# Patient Record
Sex: Male | Born: 1970 | Race: White | Hispanic: No | Smoking: Current every day smoker
Health system: Southern US, Community
[De-identification: ages and names within clinical notes are randomized; demographics above are authoritative.]

## PROBLEM LIST (undated history)

## (undated) HISTORY — PX: HERNIA REPAIR: SHX51

---

## 2003-01-30 ENCOUNTER — Emergency Department (HOSPITAL_COMMUNITY): Admission: EM | Admit: 2003-01-30 | Discharge: 2003-01-30 | Payer: Self-pay | Admitting: Emergency Medicine

## 2003-01-30 ENCOUNTER — Encounter: Payer: Self-pay | Admitting: Emergency Medicine

## 2003-05-14 ENCOUNTER — Encounter: Payer: Self-pay | Admitting: Emergency Medicine

## 2003-05-14 ENCOUNTER — Emergency Department (HOSPITAL_COMMUNITY): Admission: EM | Admit: 2003-05-14 | Discharge: 2003-05-14 | Payer: Self-pay | Admitting: Emergency Medicine

## 2003-06-05 ENCOUNTER — Emergency Department (HOSPITAL_COMMUNITY): Admission: EM | Admit: 2003-06-05 | Discharge: 2003-06-05 | Payer: Self-pay | Admitting: Emergency Medicine

## 2003-06-05 ENCOUNTER — Encounter: Payer: Self-pay | Admitting: Emergency Medicine

## 2003-06-06 ENCOUNTER — Emergency Department (HOSPITAL_COMMUNITY): Admission: EM | Admit: 2003-06-06 | Discharge: 2003-06-06 | Payer: Self-pay

## 2003-08-09 ENCOUNTER — Emergency Department (HOSPITAL_COMMUNITY): Admission: EM | Admit: 2003-08-09 | Discharge: 2003-08-09 | Payer: Self-pay | Admitting: Emergency Medicine

## 2003-08-09 ENCOUNTER — Encounter: Payer: Self-pay | Admitting: Emergency Medicine

## 2004-10-12 ENCOUNTER — Emergency Department (HOSPITAL_COMMUNITY): Admission: EM | Admit: 2004-10-12 | Discharge: 2004-10-12 | Payer: Self-pay

## 2004-10-15 ENCOUNTER — Emergency Department (HOSPITAL_COMMUNITY): Admission: EM | Admit: 2004-10-15 | Discharge: 2004-10-15 | Payer: Self-pay | Admitting: Emergency Medicine

## 2004-11-09 ENCOUNTER — Emergency Department (HOSPITAL_COMMUNITY): Admission: EM | Admit: 2004-11-09 | Discharge: 2004-11-09 | Payer: Self-pay | Admitting: Emergency Medicine

## 2005-01-10 ENCOUNTER — Emergency Department (HOSPITAL_COMMUNITY): Admission: EM | Admit: 2005-01-10 | Discharge: 2005-01-10 | Payer: Self-pay | Admitting: Emergency Medicine

## 2005-01-22 ENCOUNTER — Emergency Department (HOSPITAL_COMMUNITY): Admission: EM | Admit: 2005-01-22 | Discharge: 2005-01-22 | Payer: Self-pay | Admitting: Emergency Medicine

## 2005-02-17 ENCOUNTER — Emergency Department (HOSPITAL_COMMUNITY): Admission: EM | Admit: 2005-02-17 | Discharge: 2005-02-17 | Payer: Self-pay | Admitting: Emergency Medicine

## 2008-12-12 ENCOUNTER — Emergency Department (HOSPITAL_COMMUNITY): Admission: EM | Admit: 2008-12-12 | Discharge: 2008-12-12 | Payer: Self-pay | Admitting: Family Medicine

## 2009-01-18 ENCOUNTER — Ambulatory Visit: Payer: Self-pay | Admitting: Internal Medicine

## 2009-01-18 ENCOUNTER — Encounter: Payer: Self-pay | Admitting: Family Medicine

## 2009-01-18 LAB — CONVERTED CEMR LAB
Amphetamine Screen, Ur: NEGATIVE
Basophils Relative: 0 % (ref 0–1)
Benzodiazepines.: NEGATIVE
CO2: 26 meq/L (ref 19–32)
Calcium: 9.4 mg/dL (ref 8.4–10.5)
Chloride: 104 meq/L (ref 96–112)
Cholesterol: 186 mg/dL (ref 0–200)
Cocaine Metabolites: NEGATIVE
Creatinine, Ser: 0.8 mg/dL (ref 0.40–1.50)
Eosinophils Absolute: 0.1 10*3/uL (ref 0.0–0.7)
Eosinophils Relative: 2 % (ref 0–5)
Glucose, Bld: 96 mg/dL (ref 70–99)
HCT: 49 % (ref 39.0–52.0)
Hemoglobin: 15.1 g/dL (ref 13.0–17.0)
Lymphs Abs: 1.9 10*3/uL (ref 0.7–4.0)
MCHC: 30.8 g/dL (ref 30.0–36.0)
MCV: 84.5 fL (ref 78.0–100.0)
Marijuana Metabolite: NEGATIVE
Methadone: NEGATIVE
Monocytes Absolute: 0.5 10*3/uL (ref 0.1–1.0)
Monocytes Relative: 10 % (ref 3–12)
RBC: 5.8 M/uL (ref 4.22–5.81)
Total Bilirubin: 0.7 mg/dL (ref 0.3–1.2)
Total CHOL/HDL Ratio: 3
Total Protein: 7.6 g/dL (ref 6.0–8.3)
Triglycerides: 65 mg/dL (ref <150)
VLDL: 13 mg/dL (ref 0–40)

## 2009-02-13 ENCOUNTER — Emergency Department (HOSPITAL_COMMUNITY): Admission: EM | Admit: 2009-02-13 | Discharge: 2009-02-13 | Payer: Self-pay | Admitting: Emergency Medicine

## 2009-03-09 ENCOUNTER — Emergency Department (HOSPITAL_COMMUNITY): Admission: EM | Admit: 2009-03-09 | Discharge: 2009-03-09 | Payer: Self-pay | Admitting: Emergency Medicine

## 2009-03-17 ENCOUNTER — Emergency Department (HOSPITAL_COMMUNITY): Admission: EM | Admit: 2009-03-17 | Discharge: 2009-03-17 | Payer: Self-pay | Admitting: Family Medicine

## 2009-04-01 ENCOUNTER — Emergency Department (HOSPITAL_COMMUNITY): Admission: EM | Admit: 2009-04-01 | Discharge: 2009-04-01 | Payer: Self-pay | Admitting: Family Medicine

## 2009-04-25 ENCOUNTER — Emergency Department (HOSPITAL_COMMUNITY): Admission: EM | Admit: 2009-04-25 | Discharge: 2009-04-25 | Payer: Self-pay | Admitting: Family Medicine

## 2009-05-23 ENCOUNTER — Emergency Department (HOSPITAL_COMMUNITY): Admission: EM | Admit: 2009-05-23 | Discharge: 2009-05-23 | Payer: Self-pay | Admitting: Emergency Medicine

## 2009-06-03 ENCOUNTER — Emergency Department (HOSPITAL_COMMUNITY): Admission: EM | Admit: 2009-06-03 | Discharge: 2009-06-03 | Payer: Self-pay | Admitting: Emergency Medicine

## 2009-06-30 ENCOUNTER — Emergency Department (HOSPITAL_COMMUNITY): Admission: EM | Admit: 2009-06-30 | Discharge: 2009-06-30 | Payer: Self-pay | Admitting: Emergency Medicine

## 2009-10-19 ENCOUNTER — Emergency Department (HOSPITAL_COMMUNITY): Admission: EM | Admit: 2009-10-19 | Discharge: 2009-10-20 | Payer: Self-pay | Admitting: Emergency Medicine

## 2009-10-21 ENCOUNTER — Emergency Department (HOSPITAL_COMMUNITY): Admission: EM | Admit: 2009-10-21 | Discharge: 2009-10-22 | Payer: Self-pay | Admitting: Emergency Medicine

## 2009-10-24 ENCOUNTER — Emergency Department (HOSPITAL_COMMUNITY): Admission: EM | Admit: 2009-10-24 | Discharge: 2009-10-24 | Payer: Self-pay | Admitting: Family Medicine

## 2009-10-26 ENCOUNTER — Emergency Department (HOSPITAL_COMMUNITY): Admission: EM | Admit: 2009-10-26 | Discharge: 2009-10-26 | Payer: Self-pay | Admitting: Family Medicine

## 2009-11-07 ENCOUNTER — Ambulatory Visit: Payer: Self-pay | Admitting: Internal Medicine

## 2011-01-21 LAB — CBC
HCT: 42.5 % (ref 39.0–52.0)
Hemoglobin: 14.4 g/dL (ref 13.0–17.0)
MCHC: 34 g/dL (ref 30.0–36.0)
MCV: 83.2 fL (ref 78.0–100.0)
RBC: 5.11 MIL/uL (ref 4.22–5.81)
RDW: 14.1 % (ref 11.5–15.5)

## 2011-01-21 LAB — POCT CARDIAC MARKERS
CKMB, poc: <1 ng/mL — ABNORMAL LOW (ref 1.0–8.0)
Myoglobin, poc: 72.1 ng/mL (ref 12–200)
Troponin i, poc: <0.05 ng/mL (ref 0.00–0.09)

## 2011-01-21 LAB — BASIC METABOLIC PANEL
CO2: 26 mEq/L (ref 19–32)
Calcium: 9.1 mg/dL (ref 8.4–10.5)
Chloride: 103 mEq/L (ref 96–112)
GFR calc Af Amer: >60 mL/min (ref >60)
Glucose, Bld: 97 mg/dL (ref 70–99)
Sodium: 138 mEq/L (ref 135–145)

## 2011-01-26 LAB — POCT I-STAT, CHEM 8
Creatinine, Ser: 0.8 mg/dL (ref 0.4–1.5)
HCT: 46 % (ref 39.0–52.0)
Hemoglobin: 15.6 g/dL (ref 13.0–17.0)
Potassium: 3.8 mEq/L (ref 3.5–5.1)
Sodium: 139 mEq/L (ref 135–145)
TCO2: 26 mmol/L (ref 0–100)

## 2015-01-10 ENCOUNTER — Emergency Department (INDEPENDENT_AMBULATORY_CARE_PROVIDER_SITE_OTHER)
Admission: EM | Admit: 2015-01-10 | Discharge: 2015-01-10 | Disposition: A | Discharge status: Nursing Home (Includes ICF) | Payer: Self-pay | Source: Home / Self Care | Attending: Family Medicine | Admitting: Family Medicine

## 2015-01-10 ENCOUNTER — Emergency Department (HOSPITAL_COMMUNITY)
Admission: EM | Admit: 2015-01-10 | Discharge: 2015-01-11 | Disposition: A | Discharge status: Home / Self Care | Payer: Medicare Other | Attending: Emergency Medicine | Admitting: Emergency Medicine

## 2015-01-10 ENCOUNTER — Encounter (HOSPITAL_COMMUNITY): Payer: Self-pay

## 2015-01-10 ENCOUNTER — Encounter (HOSPITAL_COMMUNITY): Payer: Self-pay | Admitting: Emergency Medicine

## 2015-01-10 DIAGNOSIS — Z720 Tobacco use: Secondary | ICD-10-CM | POA: Insufficient documentation

## 2015-01-10 DIAGNOSIS — R1012 Left upper quadrant pain: Secondary | ICD-10-CM | POA: Diagnosis present

## 2015-01-10 DIAGNOSIS — R1032 Left lower quadrant pain: Secondary | ICD-10-CM

## 2015-01-10 LAB — POCT URINALYSIS DIP (DEVICE)
BILIRUBIN URINE: NEGATIVE
GLUCOSE, UA: NEGATIVE mg/dL
Hgb urine dipstick: NEGATIVE
KETONES UR: NEGATIVE mg/dL
LEUKOCYTES UA: NEGATIVE
Nitrite: NEGATIVE
PROTEIN: NEGATIVE mg/dL
Urobilinogen, UA: 0.2 mg/dL (ref 0.0–1.0)
pH: 6 (ref 5.0–8.0)

## 2015-01-10 LAB — COMPREHENSIVE METABOLIC PANEL
ALT: 18 U/L (ref 0–53)
AST: 22 U/L (ref 0–37)
Albumin: 4.1 g/dL (ref 3.5–5.2)
Alkaline Phosphatase: 68 U/L (ref 39–117)
Anion gap: 5 (ref 5–15)
BILIRUBIN TOTAL: 0.5 mg/dL (ref 0.3–1.2)
BUN: 23 mg/dL (ref 6–23)
CALCIUM: 8.9 mg/dL (ref 8.4–10.5)
CHLORIDE: 106 mmol/L (ref 96–112)
CO2: 28 mmol/L (ref 19–32)
Creatinine, Ser: 0.9 mg/dL (ref 0.50–1.35)
GLUCOSE: 100 mg/dL — AB (ref 70–99)
Potassium: 3.9 mmol/L (ref 3.5–5.1)
SODIUM: 139 mmol/L (ref 135–145)
Total Protein: 6.5 g/dL (ref 6.0–8.3)

## 2015-01-10 LAB — CBC WITH DIFFERENTIAL/PLATELET
Basophils Absolute: 0 10*3/uL (ref 0.0–0.1)
Basophils Relative: 0 % (ref 0–1)
EOS ABS: 0.1 10*3/uL (ref 0.0–0.7)
EOS PCT: 1 % (ref 0–5)
HEMATOCRIT: 42.1 % (ref 39.0–52.0)
HEMOGLOBIN: 13.9 g/dL (ref 13.0–17.0)
LYMPHS ABS: 2.3 10*3/uL (ref 0.7–4.0)
LYMPHS PCT: 35 % (ref 12–46)
MCH: 27 pg (ref 26.0–34.0)
MCHC: 33 g/dL (ref 30.0–36.0)
MCV: 81.7 fL (ref 78.0–100.0)
MONO ABS: 0.4 10*3/uL (ref 0.1–1.0)
MONOS PCT: 6 % (ref 3–12)
Neutro Abs: 3.8 10*3/uL (ref 1.7–7.7)
Neutrophils Relative %: 58 % (ref 43–77)
PLATELETS: 261 10*3/uL (ref 150–400)
RBC: 5.15 MIL/uL (ref 4.22–5.81)
RDW: 14.7 % (ref 11.5–15.5)
WBC: 6.6 10*3/uL (ref 4.0–10.5)

## 2015-01-10 LAB — LIPASE, BLOOD: LIPASE: 49 U/L (ref 11–59)

## 2015-01-10 MED ORDER — GI COCKTAIL ~~LOC~~
30.0000 mL | Freq: Once | ORAL | Status: AC
  Administered 2015-01-10: 30 mL via ORAL
  Filled 2015-01-10: qty 30

## 2015-01-10 MED ORDER — IOHEXOL 300 MG/ML  SOLN
25.0000 mL | Freq: Once | INTRAMUSCULAR | Status: AC | PRN
  Administered 2015-01-10: 25 mL via ORAL

## 2015-01-10 NOTE — ED Notes (Signed)
Pt states he has had abdominal pain x 3 years, worse over the past 2 days. Denies pain at this time, reports pain only when he is up moving around. Pt alert, oriented, nad.

## 2015-01-10 NOTE — ED Notes (Signed)
Sent here by [facility] for LUQ pain onset 2 days Pain increases w/pressure Denies fevers, chills, urinary sx, constipation Alert, no signs of acute distress.

## 2015-01-10 NOTE — ED Provider Notes (Signed)
CSN: [id number]     Arrival date & time 01/10/15  1544 History   First MD Initiated Contact with Patient 01/10/15 1721     Chief Complaint  Patient presents with  . Abdominal Pain   (Consider location/radiation/quality/duration/timing/severity/associated sxs/prior Treatment) Patient is a 44 y.o. male presenting with abdominal pain. The history is provided by the patient.  Abdominal Pain Pain location:  LUQ Pain quality: sharp   Pain severity:  Mild Duration:  2 days Progression:  Unchanged Chronicity:  Chronic Context: not previous surgeries and not recent illness   Relieved by:  None tried Worsened by:  Nothing tried Ineffective treatments:  None tried Associated symptoms: no anorexia, no belching, no constipation, no diarrhea, no fever, no melena, no nausea and no vomiting     History reviewed. No pertinent past medical history. History reviewed. No pertinent past surgical history. No family history on file. History  Substance Use Topics  . Smoking status: Never Smoker   . Smokeless tobacco: Not on file  . Alcohol Use: No    Review of Systems  Constitutional: Negative.  Negative for fever.  Gastrointestinal: Positive for abdominal pain. Negative for nausea, vomiting, diarrhea, constipation, melena and anorexia.    Allergies  Review of patient's allergies indicates no known allergies.  Home Medications   Prior to Admission medications   Not on File   BP 120/73 mmHg  Pulse 72  Temp(Src) 98.5 F (36.9 C) (Oral)  Resp 20  SpO2 100% Physical Exam  Constitutional: He is oriented to person, place, and time. He appears well-developed and well-nourished. No distress.  Abdominal: Bowel sounds are normal. He exhibits no distension and no mass. There is no hepatosplenomegaly. There is tenderness in the left upper quadrant. There is no rigidity, no rebound, no guarding, no CVA tenderness, no tenderness at McBurney's point and negative Murphy's sign.    Neurological: He  is alert and oriented to person, place, and time.  Skin: Skin is warm and dry.  Nursing note and vitals reviewed.   ED Course  Procedures (including critical care time) Labs Review Labs Reviewed  POCT URINALYSIS DIP (DEVICE)   U/a neg Imaging Review No results found.   MDM  No diagnosis found. Sent for further eval of luq pain, u/a neg.    Linna HoffJames D Kindl, MD 01/10/15 [phone]

## 2015-01-10 NOTE — ED Provider Notes (Signed)
CSN: [id number]     Arrival date & time 01/10/15  1825 History   First MD Initiated Contact with Patient 01/10/15 2103     Chief Complaint  Patient presents with  . Abdominal Pain     Patient is a 44 y.o. male presenting with abdominal pain. The history is provided by the patient. No language interpreter was used.  Abdominal Pain  [facility] presents for evaluation of abdominal pain. He reports that he's had 3 years of left upper quadrant and epigastric abdominal pain. The pain has been constant for 3 years. Today the pain became worse. It is described as burning in nature and constant. It is worse with movement and activity. He denies any fevers, chest pain, shortness of breath, nausea, vomiting, diarrhea, hematuria. He was seen in urgent care earlier today and referred to the emergency department for further evaluation. He reports that he is currently homeless and resides in a shelter. He denies any smoking, alcohol, drug use. Symptoms are moderate, constant, worsening.  History reviewed. No pertinent past medical history. Past Surgical History  Procedure Laterality Date  . Hernia repair     No family history on file. History  Substance Use Topics  . Smoking status: Current Every Day Smoker  . Smokeless tobacco: Not on file  . Alcohol Use: No    Review of Systems  Gastrointestinal: Positive for abdominal pain.  All other systems reviewed and are negative.     Allergies  Sulfa antibiotics  Home Medications   Prior to Admission medications   Not on File   BP 116/65 mmHg  Pulse 60  Temp(Src) 98.3 F (36.8 C) (Oral)  Resp 20  Ht 6' (1.829 m)  Wt 130 lb (58.968 kg)  BMI 17.63 kg/m2  SpO2 99% Physical Exam  Constitutional: He is oriented to person, place, and time. He appears well-developed and well-nourished.  HENT:  Head: Normocephalic and atraumatic.  Cardiovascular: Normal rate and regular rhythm.   No murmur heard. Pulmonary/Chest: Effort normal and breath  sounds normal. No respiratory distress.  Abdominal: Soft. There is no rebound and no guarding.  Mild-to-moderate epigastric and upper abdominal tenderness without guarding or rebound  Musculoskeletal: He exhibits no edema or tenderness.  Neurological: He is alert and oriented to person, place, and time.  Skin: Skin is warm and dry.  Psychiatric: He has a normal mood and affect. His behavior is normal.  Nursing note and vitals reviewed.   ED Course  Procedures (including critical care time) Labs Review Labs Reviewed  COMPREHENSIVE METABOLIC PANEL - Abnormal; Notable for the following:    Glucose, Bld 100 (*)    All other components within normal limits  CBC WITH DIFFERENTIAL/PLATELET  LIPASE, BLOOD    Imaging Review No results found.   EKG Interpretation None      MDM   Final diagnoses:  Left upper quadrant pain  Left upper quadrant pain   patient here for evaluation of abdominal pain from urgent care. He has a history of chronic abdominal pain and he has recent change in his symptoms will last 1-2 days. Patient is nontoxic appearing on exam with no history of vomiting or diarrhea. CT scan obtained to evaluate for evidence of perforated ulcer or mass lesion. Patient feeling improved in the emergency department following a GI cocktail. Plan to start Zantac with outpatient follow-up. Return precautions were discussed. Please note the patient did not have hypoxia in the emergency department and oxygen sats of 85% were documented erroneously.  Tilden FossaElizabeth Renda Pohlman, MD 01/12/15 1700

## 2015-01-10 NOTE — ED Notes (Signed)
Pt was at [facility] and came here for abd pain. Nothing makes it better. Denies any N/V/D. Has been bothering him the last year but got worse the past 2 days.

## 2015-01-11 ENCOUNTER — Encounter (HOSPITAL_COMMUNITY): Payer: Self-pay

## 2015-01-11 ENCOUNTER — Emergency Department (HOSPITAL_COMMUNITY): Payer: Medicare Other

## 2015-01-11 MED ORDER — RANITIDINE HCL 150 MG PO CAPS: 150.0000 mg | ORAL_CAPSULE | Freq: Every day | ORAL | Status: AC

## 2015-01-11 MED ORDER — IOHEXOL 300 MG/ML  SOLN
100.0000 mL | Freq: Once | INTRAMUSCULAR | Status: AC | PRN
  Administered 2015-01-11: 100 mL via INTRAVENOUS

## 2015-01-11 NOTE — Discharge Instructions (Signed)
Abdominal Pain °Many things can cause abdominal pain. Usually, abdominal pain is not caused by a disease and will improve without treatment. It can often be observed and treated at home. Your health care provider will do a physical exam and possibly order blood tests and X-rays to help determine the seriousness of your pain. However, in many cases, more time must pass before a clear cause of the pain can be found. Before that point, your health care provider may not know if you need more testing or further treatment. °HOME CARE INSTRUCTIONS  °Monitor your abdominal pain for any changes. The following actions may help to alleviate any discomfort you are experiencing: °· Only take over-the-counter or prescription medicines as directed by your health care provider. °· Do not take laxatives unless directed to do so by your health care provider. °· Try a clear liquid diet (broth, tea, or water) as directed by your health care provider. Slowly move to a bland diet as tolerated. °SEEK MEDICAL CARE IF: °· You have unexplained abdominal pain. °· You have abdominal pain associated with nausea or diarrhea. °· You have pain when you urinate or have a bowel movement. °· You experience abdominal pain that wakes you in the night. °· You have abdominal pain that is worsened or improved by eating food. °· You have abdominal pain that is worsened with eating fatty foods. °· You have a fever. °SEEK IMMEDIATE MEDICAL CARE IF:  °· Your pain does not go away within 2 hours. °· You keep throwing up (vomiting). °· Your pain is felt only in portions of the abdomen, such as the right side or the left lower portion of the abdomen. °· You pass bloody or black tarry stools. °MAKE SURE YOU: °· Understand these instructions.   °· Will watch your condition.   °· Will get help right away if you are not doing well or get worse.   °Document Released: 07/17/2005 Document Revised: 10/12/2013 Document Reviewed: 06/16/2013 °[employer]® Patient Information  ©2015 [employer]. This information is not intended to replace advice given to you by your health care provider. Make sure you discuss any questions you have with your health care provider. ° °Gastritis, Adult °Gastritis is soreness and swelling (inflammation) of the lining of the stomach. Gastritis can develop as a sudden onset (acute) or long-term (chronic) condition. If gastritis is not treated, it can lead to stomach bleeding and ulcers. °CAUSES  °Gastritis occurs when the stomach lining is weak or damaged. Digestive juices from the stomach then inflame the weakened stomach lining. The stomach lining may be weak or damaged due to viral or bacterial infections. One common bacterial infection is the Helicobacter pylori infection. Gastritis can also result from excessive alcohol consumption, taking certain medicines, or having too much acid in the stomach.  °SYMPTOMS  °In some cases, there are no symptoms. When symptoms are present, they may include: °· Pain or a burning sensation in the upper abdomen. °· Nausea. °· Vomiting. °· An uncomfortable feeling of fullness after eating. °DIAGNOSIS  °Your caregiver may suspect you have gastritis based on your symptoms and a physical exam. To determine the cause of your gastritis, your caregiver may perform the following: °· Blood or stool tests to check for the H pylori bacterium. °· Gastroscopy. A thin, flexible tube (endoscope) is passed down the esophagus and into the stomach. The endoscope has a light and camera on the end. Your caregiver uses the endoscope to view the inside of the stomach. °· Taking a tissue sample (biopsy)   from the stomach to examine under a microscope. °TREATMENT  °Depending on the cause of your gastritis, medicines may be prescribed. If you have a bacterial infection, such as an H pylori infection, antibiotics may be given. If your gastritis is caused by too much acid in the stomach, H2 blockers or antacids may be given. Your caregiver may  recommend that you stop taking aspirin, ibuprofen, or other nonsteroidal anti-inflammatory drugs (NSAIDs). °HOME CARE INSTRUCTIONS °· Only take over-the-counter or prescription medicines as directed by your caregiver. °· If you were given antibiotic medicines, take them as directed. Finish them even if you start to feel better. °· Drink enough fluids to keep your urine clear or pale yellow. °· Avoid foods and drinks that make your symptoms worse, such as: °¨ Caffeine or alcoholic drinks. °¨ Chocolate. °¨ Peppermint or mint flavorings. °¨ Garlic and onions. °¨ Spicy foods. °¨ Citrus fruits, such as oranges, lemons, or limes. °¨ Tomato-based foods such as sauce, chili, salsa, and pizza. °¨ Fried and fatty foods. °· Eat small, frequent meals instead of large meals. °SEEK IMMEDIATE MEDICAL CARE IF:  °· You have black or dark red stools. °· You vomit blood or material that looks like coffee grounds. °· You are unable to keep fluids down. °· Your abdominal pain gets worse. °· You have a fever. °· You do not feel better after 1 week. °· You have any other questions or concerns. °MAKE SURE YOU: °· Understand these instructions. °· Will watch your condition. °· Will get help right away if you are not doing well or get worse. °Document Released: 10/01/2001 Document Revised: 04/07/2012 Document Reviewed: 11/20/2011 °ExitCare® Patient Information ©2015 [employer]. This information is not intended to replace advice given to you by your health care provider. Make sure you discuss any questions you have with your health care provider. ° °

## 2016-02-04 IMAGING — CT CT ABD-PELV W/ CM
2 of 5 series · 16 of 46 positions shown, 18 images · IV contrast (Omni 300)
Comparison: None.

CLINICAL DATA: Acute onset of left-sided abdominal pain for 2 days.
Initial encounter.

EXAM:
CT ABDOMEN AND PELVIS WITH CONTRAST
TECHNIQUE: Multidetector CT imaging of the abdomen and pelvis was performed
using the standard protocol following bolus administration of
intravenous contrast.
CONTRAST:  100mL OMNIPAQUE IOHEXOL 300 MG/ML  SOLN

[Series 2: abd/ pelvis 5.0 i30f 1 · axial · 0.72mm/px · z∈[-515,-85]mm · 13 of 96 slices shown, 15 images]
[im 5/96  soft-tissue]
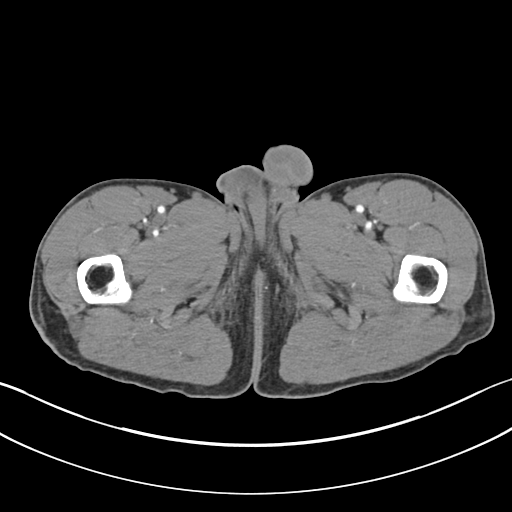
[im 5/96  bone]
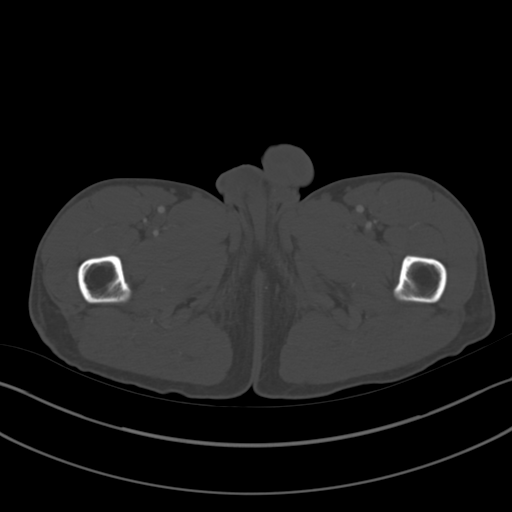
[im 15/96  soft-tissue]
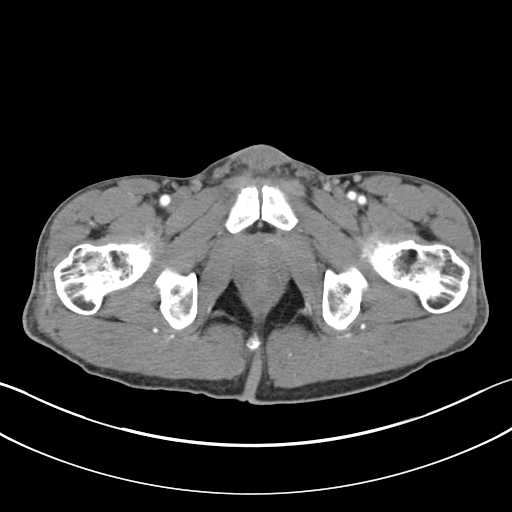
[im 20/96  soft-tissue]
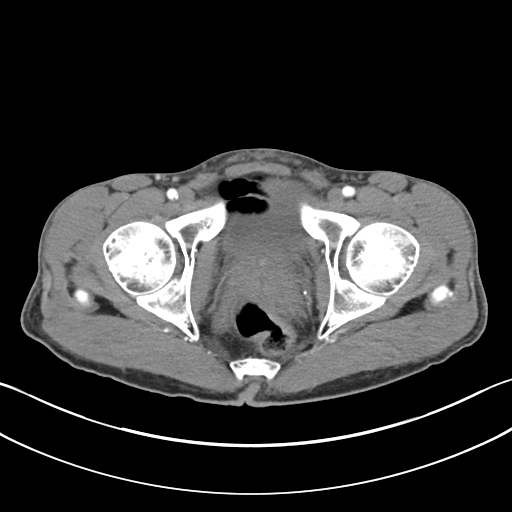
[im 29/96  soft-tissue]
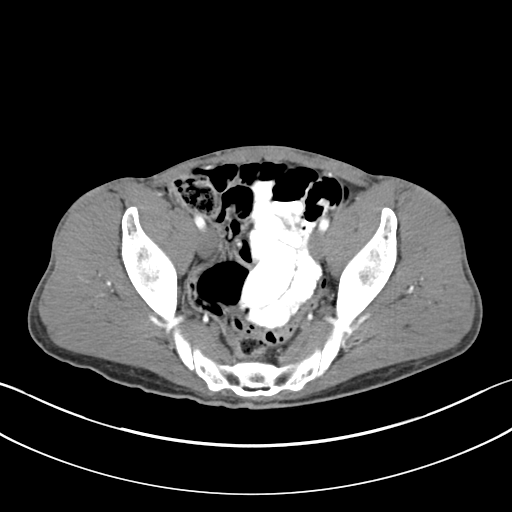
[im 34/96  soft-tissue]
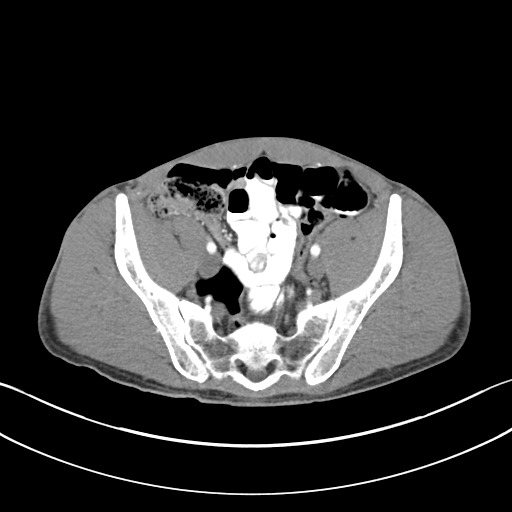
[im 43/96  soft-tissue]
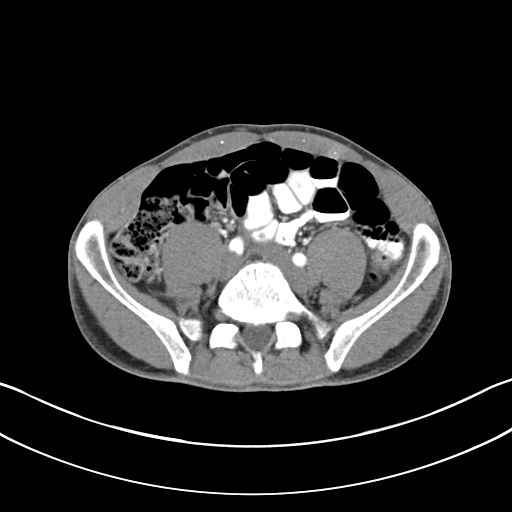
[im 48/96  soft-tissue]
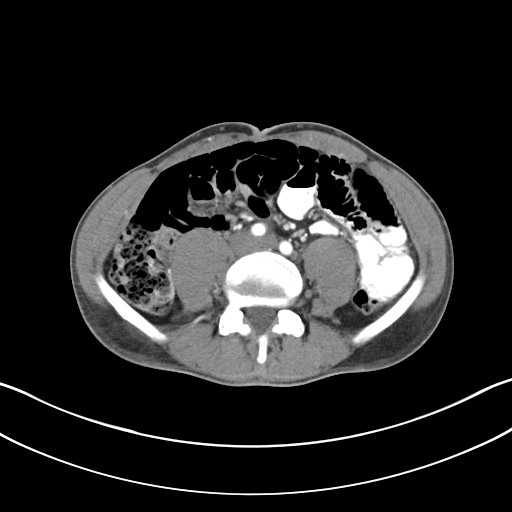
[im 53/96  soft-tissue]
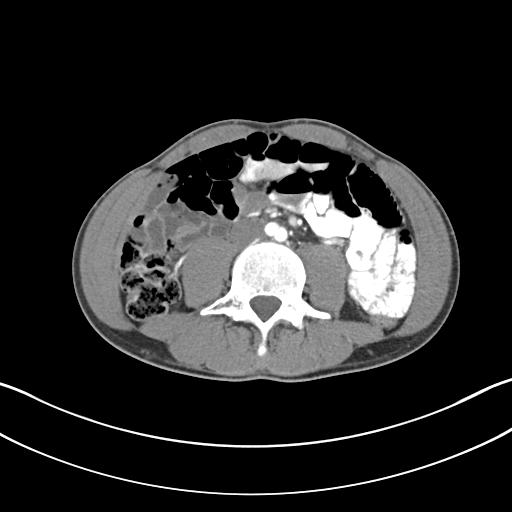
[im 62/96  soft-tissue]
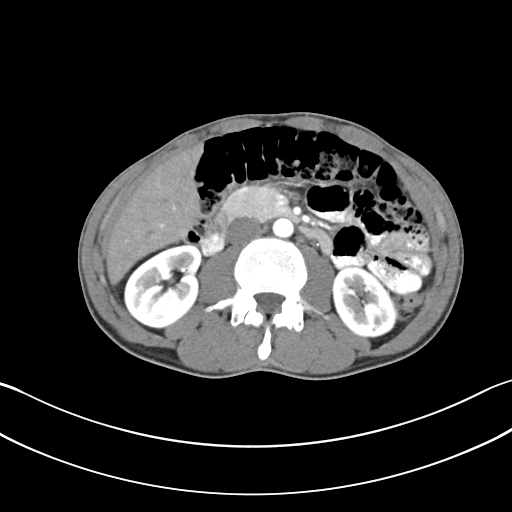
[im 62/96  bone]
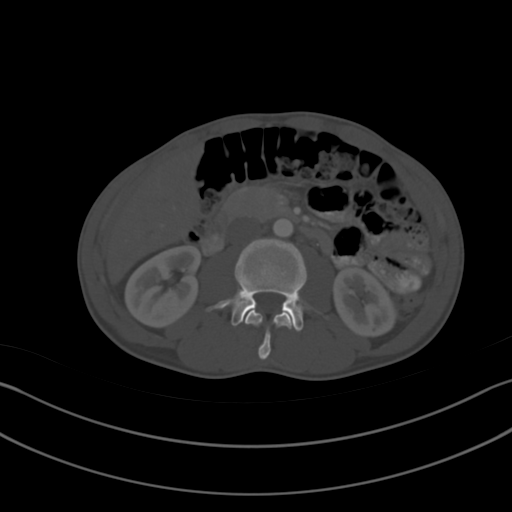
[im 67/96  soft-tissue]
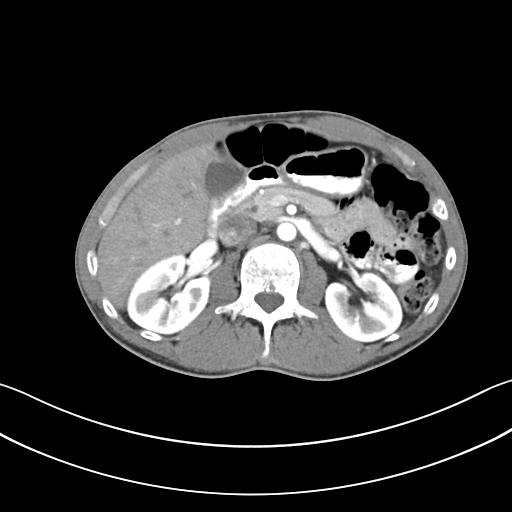
[im 77/96  soft-tissue]
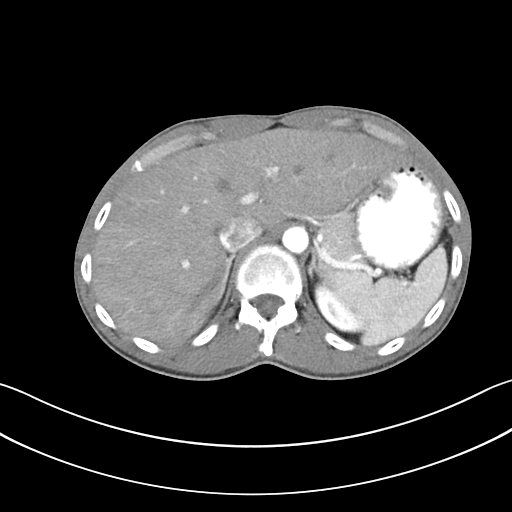
[im 81/96  soft-tissue]
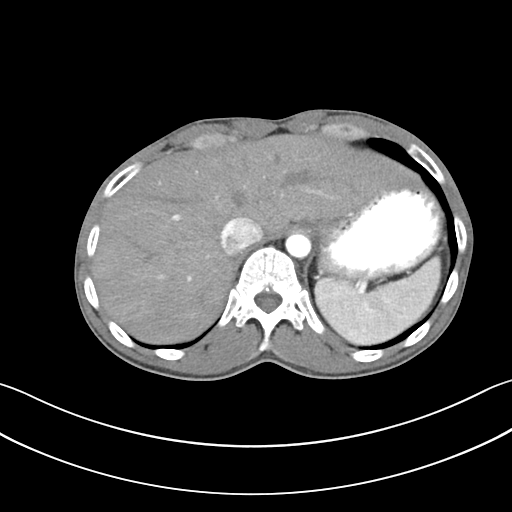
[im 91/96  soft-tissue]
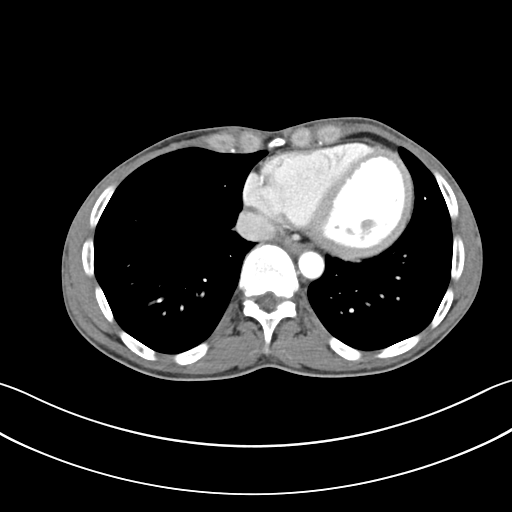

[Series 5: coronals · coronal · 0.70mm/px · 3 of 104 slices shown]
[im 35/104  soft-tissue]
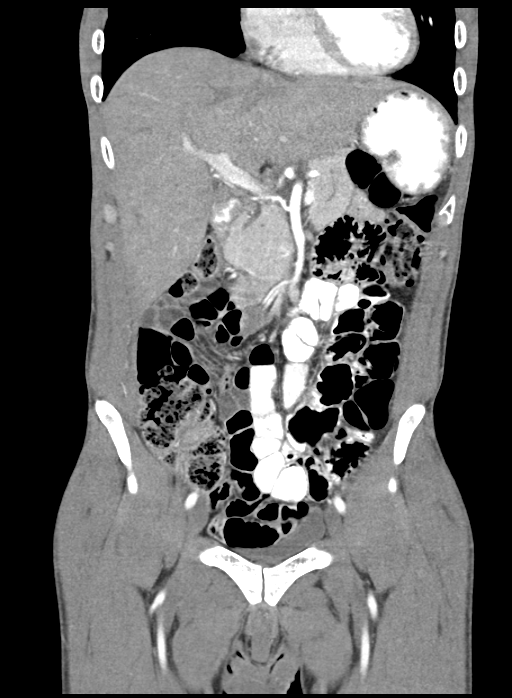
[im 46/104  soft-tissue]
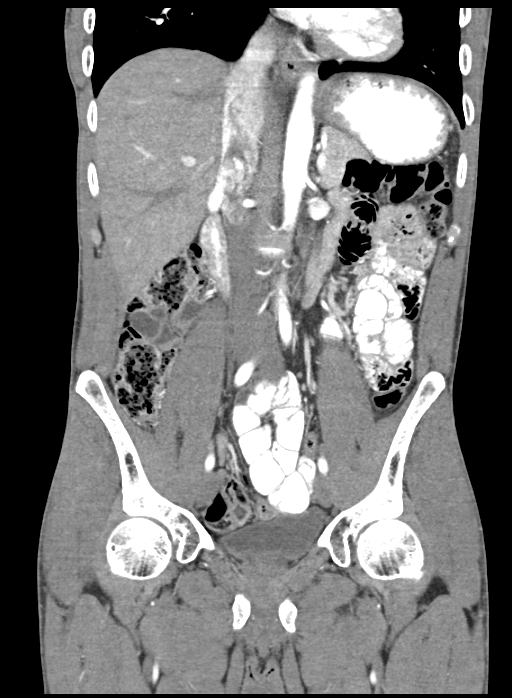
[im 58/104  soft-tissue]
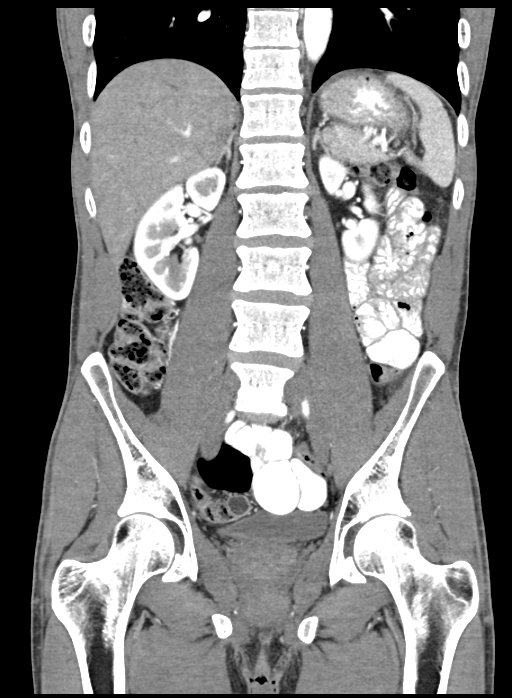

[16 of 46 positions shown; findings below may reference images not displayed]

FINDINGS: The visualized lung bases are clear.

The liver and spleen are unremarkable in appearance. The gallbladder
is within normal limits. The pancreas and adrenal glands are
unremarkable.

The kidneys are unremarkable in appearance. There is no evidence of
hydronephrosis. No renal or ureteral stones are seen. No perinephric
stranding is appreciated.

No free fluid is identified. The small bowel is unremarkable in
appearance. The stomach is within normal limits. No acute vascular
abnormalities are seen.

The appendix is normal in caliber, without evidence of appendicitis.
The colon is unremarkable in appearance.

The bladder is mildly distended and grossly unremarkable. The
prostate is borderline normal in size. No inguinal lymphadenopathy
is seen.

No acute osseous abnormalities are identified. Mild vacuum
phenomenon at L5-S1 is often a normal finding when seen in
isolation.
IMPRESSION: No acute abnormality seen within the abdomen or pelvis.
